# Patient Record
Sex: Female | Born: 1995 | Race: White | Hispanic: No | Marital: Single | Smoking: Former smoker
Health system: Southern US, Community
[De-identification: ages and names within clinical notes are randomized; demographics above are authoritative.]

## PROBLEM LIST (undated history)

## (undated) DIAGNOSIS — F32A Depression, unspecified: Secondary | ICD-10-CM

## (undated) DIAGNOSIS — F329 Major depressive disorder, single episode, unspecified: Secondary | ICD-10-CM

## (undated) HISTORY — PX: TONSILLECTOMY: SUR1361

## (undated) HISTORY — DX: Depression, unspecified: F32.A

---
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
## 2015-03-07 ENCOUNTER — Encounter: Payer: Self-pay | Admitting: Family Medicine

## 2015-03-07 LAB — HEPATITIS B SURFACE ANTIBODY,QUALITATIVE: Hep B S Ab: POSITIVE

## 2015-03-07 LAB — RUBEOLA ANTIBODY IGG: RUBEOLA AB, IGG: POSITIVE

## 2015-03-07 LAB — HEPATITIS B SURFACE ANTIGEN: Hepatitis B Surface Antigen: NEGATIVE

## 2015-03-07 LAB — HM HEPATITIS C SCREENING LAB: HM Hepatitis Screen: NEGATIVE

## 2015-04-12 ENCOUNTER — Encounter: Payer: Self-pay | Admitting: Family Medicine

## 2017-01-21 ENCOUNTER — Encounter: Payer: Self-pay | Admitting: Family Medicine

## 2018-01-20 LAB — RUBELLA IGG AB(REFL): Rubella IgG Quant: POSITIVE

## 2018-01-25 ENCOUNTER — Encounter: Payer: Self-pay | Admitting: Family Medicine

## 2018-04-16 LAB — MUMPS ANTIBODY, IGG: Mumps IgG: POSITIVE

## 2018-04-26 ENCOUNTER — Encounter: Payer: Self-pay | Admitting: Family Medicine

## 2019-08-02 ENCOUNTER — Telehealth: Payer: Self-pay | Admitting: *Deleted

## 2019-08-02 NOTE — Telephone Encounter (Signed)
Copied from [place] [id number]. Topic: Appointment Scheduling - Scheduling Inquiry for Clinic >> Aug 02, 2019  1:57 PM Celene Kras A wrote: Reason for CRM: Pt called and requested to be seen by Dr. Charlett Blake. Pt states she was referred by another doctor. Please advise.

## 2019-08-04 NOTE — Telephone Encounter (Signed)
Please advise 

## 2019-08-04 NOTE — Telephone Encounter (Signed)
Sure I will see

## 2019-08-09 NOTE — Telephone Encounter (Signed)
Please schedule patient for new patient appt for November or December

## 2019-08-10 NOTE — Telephone Encounter (Signed)
Called pt. No answer left msg.

## 2019-08-17 ENCOUNTER — Telehealth: Payer: Self-pay | Admitting: Family Medicine

## 2019-08-17 ENCOUNTER — Other Ambulatory Visit: Payer: Self-pay

## 2019-08-17 ENCOUNTER — Ambulatory Visit (INDEPENDENT_AMBULATORY_CARE_PROVIDER_SITE_OTHER): Payer: BC Managed Care – PPO | Admitting: Family Medicine

## 2019-08-17 ENCOUNTER — Encounter: Payer: Self-pay | Admitting: Family Medicine

## 2019-08-17 VITALS — BP 104/59 | HR 70 | Temp 98.7°F | Ht 68.0 in | Wt 202.0 lb

## 2019-08-17 DIAGNOSIS — R230 Cyanosis: Secondary | ICD-10-CM

## 2019-08-17 DIAGNOSIS — Z111 Encounter for screening for respiratory tuberculosis: Secondary | ICD-10-CM | POA: Diagnosis not present

## 2019-08-17 DIAGNOSIS — L659 Nonscarring hair loss, unspecified: Secondary | ICD-10-CM | POA: Insufficient documentation

## 2019-08-17 DIAGNOSIS — Z13220 Encounter for screening for lipoid disorders: Secondary | ICD-10-CM | POA: Diagnosis not present

## 2019-08-17 DIAGNOSIS — Z23 Encounter for immunization: Secondary | ICD-10-CM

## 2019-08-17 DIAGNOSIS — Z8659 Personal history of other mental and behavioral disorders: Secondary | ICD-10-CM

## 2019-08-17 DIAGNOSIS — N923 Ovulation bleeding: Secondary | ICD-10-CM

## 2019-08-17 DIAGNOSIS — Z0184 Encounter for antibody response examination: Secondary | ICD-10-CM

## 2019-08-17 NOTE — Progress Notes (Signed)
New Patient Office Visit  Subjective:  Patient ID: Allison [employer], female    DOB: Oct 17, 1996  Age: 23 y.o. MRN: [id number]  CC:  Chief Complaint  Patient presents with  . Establish Care    HPI [employer] presents for to establish care.  I have seen her mother for years.  She has a couple of concerns that she would like to discuss today.  She did let me know that she had actually lost 30 pounds in the last year.  She purposely tried to lose weight by using the keto diet.  But over the last 2 months has been losing hair.  She feels like is just a little bit all over her head.  She says she feels like it is constantly coming out and she is pulling it off her shirt.  She says that her scalp has been a little bit itchy but no rash.  She recently started taking some biotin, iron and multivitamin but has not felt like it made a noticeable difference in the hair loss and shedding.  She does have a history of anemia and says that her periods are regular with moderate flow.  Occasionally heavy but not persistently so.  She denies any abnormal rashes fevers or chills or sweats.  She also reports one episode where she noticed that her to looked blue.  It was maybe a month or so ago.  It is only happened 1 time and it did resolve pretty quickly.  No family history of autoimmune disorders.  She does report a history of anxiety but says is not currently having any issues with that.  She retired from [employer] on July 2019 and is currently starting college at [employer].  She also noted that she bleeds with intercourse.  It seems to be almost every time.  Otherwise she does not spot between her periods.  She does not have any abdominal pain or pelvic pain with intercourse.   Past Medical History:  Diagnosis Date  . Depressed     Past Surgical History:  Procedure Laterality Date  . TONSILLECTOMY      Family History  Problem Relation Age of Onset  . Hypertension Mother   . Colon  cancer Father     Social History   Socioeconomic History  . Marital status: Single    Spouse name: Not on file  . Number of children: 0  . Years of education: Not on file  . Highest education level: Not on file  Occupational History  . Occupation: Electronics engineer.   Social Needs  . Financial resource strain: Not on file  . Food insecurity    Worry: Not on file    Inability: Not on file  . Transportation needs    Medical: Not on file    Non-medical: Not on file  Tobacco Use  . Smoking status: Former Smoker    Packs/day: 5.00    Years: 3.00    Pack years: 15.00    Types: Cigarettes    Start date: 12/23/2015    Quit date: 08/17/2019  . Smokeless tobacco: Never Used  . Tobacco comment: currently vaping  Substance and Sexual Activity  . Alcohol use: Yes    Alcohol/week: 2.0 standard drinks    Types: 2 Glasses of wine per week  . Drug use: Never  . Sexual activity: Not Currently    Partners: Male    Birth control/protection: Abstinence, Inserts    Comment: ring  Lifestyle  .  Physical activity    Days per week: Not on file    Minutes per session: Not on file  . Stress: Not on file  Relationships  . Social Herbalist on phone: Not on file    Gets together: Not on file    Attends religious service: Not on file    Active member of club or organization: Not on file    Attends meetings of clubs or organizations: Not on file    Relationship status: Not on file  . Intimate partner violence    Fear of current or ex partner: Not on file    Emotionally abused: Not on file    Physically abused: Not on file    Forced sexual activity: Not on file  Other Topics Concern  . Not on file  Social History Narrative   Step mom is [employer].     ROS Review of Systems  Constitutional: Negative for diaphoresis, fever and unexpected weight change.  HENT: Negative for hearing loss, postnasal drip, sneezing and tinnitus.   Eyes: Negative for visual disturbance.   Respiratory: Negative for cough and wheezing.   Cardiovascular: Negative for chest pain and palpitations.  Genitourinary: Negative for vaginal bleeding and vaginal discharge.  Musculoskeletal: Negative for arthralgias.  Skin:       Hair and nails concern  Neurological: Negative for headaches.  Hematological: Negative for adenopathy. Does not bruise/bleed easily.    Objective:   Today's Vitals: BP (!) 104/59   Pulse 70   Temp 98.7 F (37.1 C)   Ht '5\' 8"'  (1.727 m)   Wt 202 lb (91.6 kg)   LMP 07/28/2019 (Approximate)   SpO2 99%   BMI 30.71 kg/m   Physical Exam Constitutional:      Appearance: She is well-developed.  HENT:     Head: Normocephalic and atraumatic.  Cardiovascular:     Rate and Rhythm: Normal rate and regular rhythm.     Heart sounds: Normal heart sounds.  Pulmonary:     Effort: Pulmonary effort is normal.     Breath sounds: Normal breath sounds.  Skin:    Kerr: Skin is warm and dry.  Neurological:     Mental Status: She is alert and oriented to person, place, and time.  Psychiatric:        Behavior: Behavior normal.     Assessment & Plan:   Problem List Items Addressed This Visit      Other   Intermenstrual bleeding    Cars only with intercourse.  She really does not have any pelvic pain or problems during intercourse.  She is due for her Pap smear so I plan to schedule her for a physical and Pap next month.  We will rule out any abnormalities of the vaginal canal or cervix.  Can consider further work-up if needed at that time.      History of anxiety    Not currently an active problem but will monitor for this periodically.      Hair loss    It sounds like it is diffuse hair loss.  At such a young age I suspect that it is probably just transient.  She has been under a lot of stress in the last year with retiring from the [employer] and starting at [employer] and then having to switch schools.  Sometimes a physical and emotional stress can  actually cause hair loss for several months that usually will resolve on its own but we  will do some additional labs just to rule out anemia especially with prior history etc.      Relevant Orders   C-reactive protein   ANA   TSH   Sed Rate (ESR)   CBC   Blue toes - Primary    This actually only occurred once so encouraged her just to keep an eye on things.  We will check a CBC and ANA.      Relevant Orders   C-reactive protein   ANA   TSH   Sed Rate (ESR)   CBC    Other Visit Diagnoses    Need for immunization against influenza       Relevant Orders   Flu Vaccine QUAD 36+ mos IM (Completed)   Screening, lipid       Relevant Orders   COMPLETE METABOLIC PANEL WITH GFR   Lipid Panel w/reflex Direct LDL   Immunity status testing       Relevant Orders   Measles/Mumps/Rubella Immunity   Encounter for PPD test       Need for tetanus, diphtheria, and acellular pertussis (Tdap) vaccine in patient of adolescent age or older       Relevant Orders   Tdap vaccine greater than or equal to 7yo IM (Completed)   Screening for tuberculosis       Relevant Orders   QuantiFERON-TB Gold Plus      Outpatient Encounter Medications as of 08/17/2019  Medication Sig  . [DISCONTINUED] citalopram (CELEXA) 40 MG tablet Take 40 mg by mouth daily.   No facility-administered encounter medications on file as of 08/17/2019.     Follow-up: Return in about 4 weeks (around 09/14/2019) for CPE with pap smear.   Beatrice Lecher, MD

## 2019-08-17 NOTE — Assessment & Plan Note (Signed)
Cars only with intercourse.  She really does not have any pelvic pain or problems during intercourse.  She is due for her Pap smear so I plan to schedule her for a physical and Pap next month.  We will rule out any abnormalities of the vaginal canal or cervix.  Can consider further work-up if needed at that time.

## 2019-08-17 NOTE — Assessment & Plan Note (Signed)
It sounds like it is diffuse hair loss.  At such a young age I suspect that it is probably just transient.  She has been under a lot of stress in the last year with retiring from the [employer] and starting at [employer] and then having to switch schools.  Sometimes a physical and emotional stress can actually cause hair loss for several months that usually will resolve on its own but we will do some additional labs just to rule out anemia especially with prior history etc.

## 2019-08-17 NOTE — Assessment & Plan Note (Signed)
This actually only occurred once so encouraged her just to keep an eye on things.  We will check a CBC and ANA.

## 2019-08-17 NOTE — Telephone Encounter (Signed)
Please call patient and see where she goes for her GYN and where she had her Pap smear done.  She may have total time yesterday I am not sure but if we can call and get that report that would be great.  I know she said she had it done about 2 years ago.

## 2019-08-17 NOTE — Assessment & Plan Note (Signed)
Not currently an active problem but will monitor for this periodically.

## 2019-08-19 NOTE — Telephone Encounter (Signed)
Pt had this done while she was in the [employer]. I put her records on your desk.Maryruth Eve, Lahoma Crocker, CMA

## 2019-08-20 LAB — QUANTIFERON-TB GOLD PLUS
Mitogen-NIL: 8.08 IU/mL
NIL: 0.04 IU/mL
QuantiFERON-TB Gold Plus: NEGATIVE
TB1-NIL: 0 IU/mL
TB2-NIL: <0 IU/mL

## 2019-08-20 LAB — COMPLETE METABOLIC PANEL WITH GFR
AG Ratio: 1.9 (calc) (ref 1.0–2.5)
ALT: 15 U/L (ref 6–29)
AST: 22 U/L (ref 10–30)
Albumin: 4.7 g/dL (ref 3.6–5.1)
Alkaline phosphatase (APISO): 84 U/L (ref 31–125)
BUN: 19 mg/dL (ref 7–25)
CO2: 26 mmol/L (ref 20–32)
Calcium: 10 mg/dL (ref 8.6–10.2)
Chloride: 102 mmol/L (ref 98–110)
Creat: 1.02 mg/dL (ref 0.50–1.10)
GFR, Est African American: 89 mL/min/{1.73_m2} (ref >=60)
GFR, Est Non African American: 77 mL/min/{1.73_m2} (ref >=60)
Globulin: 2.5 g/dL (calc) (ref 1.9–3.7)
Glucose, Bld: 88 mg/dL (ref 65–99)
Potassium: 4.6 mmol/L (ref 3.5–5.3)
Sodium: 137 mmol/L (ref 135–146)
Total Bilirubin: 0.3 mg/dL (ref 0.2–1.2)
Total Protein: 7.2 g/dL (ref 6.1–8.1)

## 2019-08-20 LAB — CBC
HCT: 43.2 % (ref 35.0–45.0)
Hemoglobin: 13.9 g/dL (ref 11.7–15.5)
MCH: 27.9 pg (ref 27.0–33.0)
MCHC: 32.2 g/dL (ref 32.0–36.0)
MCV: 86.7 fL (ref 80.0–100.0)
MPV: 11.2 fL (ref 7.5–12.5)
Platelets: 250 10*3/uL (ref 140–400)
RBC: 4.98 10*6/uL (ref 3.80–5.10)
RDW: 13.1 % (ref 11.0–15.0)
WBC: 8.2 10*3/uL (ref 3.8–10.8)

## 2019-08-20 LAB — MEASLES/MUMPS/RUBELLA IMMUNITY
Mumps IgG: 23.2 AU/mL
Rubella: 1.84 index
Rubeola IgG: >300 AU/mL

## 2019-08-20 LAB — ANA: Anti Nuclear Antibody (ANA): NEGATIVE

## 2019-08-20 LAB — SEDIMENTATION RATE: Sed Rate: 9 mm/h (ref 0–20)

## 2019-08-20 LAB — LIPID PANEL W/REFLEX DIRECT LDL
Cholesterol: 201 mg/dL — ABNORMAL HIGH (ref <200)
HDL: 46 mg/dL — ABNORMAL LOW (ref >=50)
LDL Cholesterol (Calc): 136 mg/dL (calc) — ABNORMAL HIGH
Non-HDL Cholesterol (Calc): 155 mg/dL (calc) — ABNORMAL HIGH (ref <130)
Total CHOL/HDL Ratio: 4.4 (calc) (ref <5.0)
Triglycerides: 88 mg/dL (ref <150)

## 2019-08-20 LAB — TSH: TSH: 2.84 mIU/L

## 2019-08-20 LAB — C-REACTIVE PROTEIN: CRP: 4.9 mg/L (ref <8.0)

## 2019-09-14 ENCOUNTER — Encounter: Payer: Self-pay | Admitting: Family Medicine

## 2019-09-14 ENCOUNTER — Ambulatory Visit (INDEPENDENT_AMBULATORY_CARE_PROVIDER_SITE_OTHER): Payer: BC Managed Care – PPO | Admitting: Family Medicine

## 2019-09-14 ENCOUNTER — Other Ambulatory Visit (HOSPITAL_COMMUNITY)
Admission: RE | Admit: 2019-09-14 | Discharge: 2019-09-14 | Disposition: A | Discharge status: Home / Self Care | Payer: BC Managed Care – PPO | Source: Ambulatory Visit | Attending: Family Medicine | Admitting: Family Medicine

## 2019-09-14 ENCOUNTER — Other Ambulatory Visit: Payer: Self-pay

## 2019-09-14 VITALS — BP 119/65 | HR 71 | Ht 68.0 in | Wt 202.0 lb

## 2019-09-14 DIAGNOSIS — Z113 Encounter for screening for infections with a predominantly sexual mode of transmission: Secondary | ICD-10-CM

## 2019-09-14 DIAGNOSIS — Z0000 Encounter for general adult medical examination without abnormal findings: Secondary | ICD-10-CM

## 2019-09-14 DIAGNOSIS — Z124 Encounter for screening for malignant neoplasm of cervix: Secondary | ICD-10-CM | POA: Diagnosis not present

## 2019-09-14 NOTE — Progress Notes (Signed)
Subjective:     [employer] Kerr is a 23 y.o. female and is here for a comprehensive physical exam. The patient reports no problems.  She is exercising regularly.  She says she is back in school full-time and just has really struggled with focus.  She is never really had any major issues with that before but says just feels like she gets very easily distracted during class.  She is never been diagnosed with ADD or ADHD.  She is currently on citalopram and BuSpar for mood but her [facility] provider has recommended switching her to Wellbutrin so she is expecting a shipment in the mail any day now.  Still having hair loss. No localized loss.    Social History   Socioeconomic History  . Marital status: Single    Spouse name: Not on file  . Number of children: 0  . Years of education: Not on file  . Highest education level: Not on file  Occupational History  . Occupation: Archivist.   Social Needs  . Financial resource strain: Not on file  . Food insecurity    Worry: Not on file    Inability: Not on file  . Transportation needs    Medical: Not on file    Non-medical: Not on file  Tobacco Use  . Smoking status: Former Smoker    Packs/day: 5.00    Years: 3.00    Pack years: 15.00    Types: Cigarettes    Start date: 12/23/2015    Quit date: 08/17/2019    Years since quitting: 0.0  . Smokeless tobacco: Never Used  . Tobacco comment: currently vaping  Substance and Sexual Activity  . Alcohol use: Yes    Alcohol/week: 2.0 standard drinks    Types: 2 Glasses of wine per week  . Drug use: Never  . Sexual activity: Not Currently    Partners: Male    Birth control/protection: Abstinence, Inserts    Comment: ring  Lifestyle  . Physical activity    Days per week: Not on file    Minutes per session: Not on file  . Stress: Not on file  Relationships  . Social Musician on phone: Not on file    Gets together: Not on file    Attends religious service: Not on file    Active member  of club or organization: Not on file    Attends meetings of clubs or organizations: Not on file    Relationship status: Not on file  . Intimate partner violence    Fear of current or ex partner: Not on file    Emotionally abused: Not on file    Physically abused: Not on file    Forced sexual activity: Not on file  Other Topics Concern  . Not on file  Social History Narrative   Step mom is Kimberley Speece.    Health Maintenance  Topic Date Due  . HIV Screening  05/03/2010  . PAP-Cervical Cytology Screening  05/03/2016  . PAP SMEAR-Modifier  05/03/2016  . TETANUS/TDAP  08/16/2029  . INFLUENZA VACCINE  Completed    The following portions of the patient's history were reviewed and updated as appropriate: allergies, current medications, past family history, past medical history, past social history, past surgical history and problem list.  Review of Systems  Review of systems not obtained due to patient factors.   Objective:      BP 119/65   Pulse 71   Ht 5\' 8"  (1.727  m)   Wt 202 lb (91.6 kg)   SpO2 99%   BMI 30.71 kg/m  General appearance: alert, cooperative and appears stated age Head: Normocephalic, without obvious abnormality, atraumatic Eyes: conj clear, EOMI, PEERLA Ears: normal TM's and external ear canals both ears Nose: Nares normal. Septum midline. Mucosa normal. No drainage or sinus tenderness. Throat: lips, mucosa, and tongue normal; teeth and gums normal Neck: no adenopathy, no carotid bruit, no JVD, supple, symmetrical, trachea midline and thyroid not enlarged, symmetric, no tenderness/mass/nodules Back: symmetric, no curvature. ROM normal. No CVA tenderness. Lungs: clear to auscultation bilaterally Breasts: normal appearance, no masses or tenderness Heart: regular rate and rhythm, S1, S2 normal, no murmur, click, rub or gallop Abdomen: soft, non-tender; bowel sounds normal; no masses,  no organomegaly Pelvic: cervix normal in appearance, external genitalia  normal, no adnexal masses or tenderness, no cervical motion tenderness, rectovaginal septum normal, uterus normal size, shape, and consistency and vagina normal without discharge Extremities: extremities normal, atraumatic, no cyanosis or edema Pulses: 2+ and symmetric Skin: Skin color, texture, turgor normal. No rashes or lesions Lymph nodes: Cervical, supraclavicular, and axillary nodes normal. Neurologic: Alert and oriented X 3, normal strength and tone. Normal symmetric reflexes. Normal coordination and gait    Assessment:    Healthy female exam.      Plan:     See After Visit Summary for Counseling Recommendations   Keep up a regular exercise program and make sure you are eating a healthy diet Try to eat 4 servings of dairy a day, or if you are lactose intolerant take a calcium with vitamin D daily.  Your vaccines are up to date.   Inattention-  Hair Loss-

## 2019-09-14 NOTE — Patient Instructions (Signed)
Health Maintenance, Female Adopting a healthy lifestyle and getting preventive care are important in promoting health and wellness. Ask your health care provider about:  The right schedule for you to have regular tests and exams.  Things you can do on your own to prevent diseases and keep yourself healthy. What should I know about diet, weight, and exercise? Eat a healthy diet   Eat a diet that includes plenty of vegetables, fruits, low-fat dairy products, and lean protein.  Do not eat a lot of foods that are high in solid fats, added sugars, or sodium. Maintain a healthy weight Body mass index (BMI) is used to identify weight problems. It estimates body fat based on height and weight. Your health care provider can help determine your BMI and help you achieve or maintain a healthy weight. Get regular exercise Get regular exercise. This is one of the most important things you can do for your health. Most adults should:  Exercise for at least 150 minutes each week. The exercise should increase your heart rate and make you sweat (moderate-intensity exercise).  Do strengthening exercises at least twice a week. This is in addition to the moderate-intensity exercise.  Spend less time sitting. Even light physical activity can be beneficial. Watch cholesterol and blood lipids Have your blood tested for lipids and cholesterol at 24 years of age, then have this test every 5 years. Have your cholesterol levels checked more often if:  Your lipid or cholesterol levels are high.  You are older than 24 years of age.  You are at high risk for heart disease. What should I know about cancer screening? Depending on your health history and family history, you may need to have cancer screening at various ages. This may include screening for:  Breast cancer.  Cervical cancer.  Colorectal cancer.  Skin cancer.  Lung cancer. What should I know about heart disease, diabetes, and high blood  pressure? Blood pressure and heart disease  High blood pressure causes heart disease and increases the risk of stroke. This is more likely to develop in people who have high blood pressure readings, are of African descent, or are overweight.  Have your blood pressure checked: ? Every 3-5 years if you are 18-39 years of age. ? Every year if you are 40 years old or older. Diabetes Have regular diabetes screenings. This checks your fasting blood sugar level. Have the screening done:  Once every three years after age 40 if you are at a normal weight and have a low risk for diabetes.  More often and at a younger age if you are overweight or have a high risk for diabetes. What should I know about preventing infection? Hepatitis B If you have a higher risk for hepatitis B, you should be screened for this virus. Talk with your health care provider to find out if you are at risk for hepatitis B infection. Hepatitis C Testing is recommended for:  Everyone born from 1945 through 1965.  Anyone with known risk factors for hepatitis C. Sexually transmitted infections (STIs)  Get screened for STIs, including gonorrhea and chlamydia, if: ? You are sexually active and are younger than 24 years of age. ? You are older than 24 years of age and your health care provider tells you that you are at risk for this type of infection. ? Your sexual activity has changed since you were last screened, and you are at increased risk for chlamydia or gonorrhea. Ask your health care provider if   you are at risk.  Ask your health care provider about whether you are at high risk for HIV. Your health care provider may recommend a prescription medicine to help prevent HIV infection. If you choose to take medicine to prevent HIV, you should first get tested for HIV. You should then be tested every 3 months for as long as you are taking the medicine. Pregnancy  If you are about to stop having your period (premenopausal) and  you may become pregnant, seek counseling before you get pregnant.  Take 400 to 800 micrograms (mcg) of folic acid every day if you become pregnant.  Ask for birth control (contraception) if you want to prevent pregnancy. Osteoporosis and menopause Osteoporosis is a disease in which the bones lose minerals and strength with aging. This can result in bone fractures. If you are 65 years old or older, or if you are at risk for osteoporosis and fractures, ask your health care provider if you should:  Be screened for bone loss.  Take a calcium or vitamin D supplement to lower your risk of fractures.  Be given hormone replacement therapy (HRT) to treat symptoms of menopause. Follow these instructions at home: Lifestyle  Do not use any products that contain nicotine or tobacco, such as cigarettes, e-cigarettes, and chewing tobacco. If you need help quitting, ask your health care provider.  Do not use street drugs.  Do not share needles.  Ask your health care provider for help if you need support or information about quitting drugs. Alcohol use  Do not drink alcohol if: ? Your health care provider tells you not to drink. ? You are pregnant, may be pregnant, or are planning to become pregnant.  If you drink alcohol: ? Limit how much you use to 0-1 drink a day. ? Limit intake if you are breastfeeding.  Be aware of how much alcohol is in your drink. In the [place] one drink equals one 12 oz bottle of beer (355 mL), one 5 oz glass of wine (148 mL), or one 1 oz glass of hard liquor (44 mL). General instructions  Schedule regular health, dental, and eye exams.  Stay current with your vaccines.  Tell your health care provider if: ? You often feel depressed. ? You have ever been abused or do not feel safe at home. Summary  Adopting a healthy lifestyle and getting preventive care are important in promoting health and wellness.  Follow your health care provider's instructions about healthy  diet, exercising, and getting tested or screened for diseases.  Follow your health care provider's instructions on monitoring your cholesterol and blood pressure. This information is not intended to replace advice given to you by your health care provider. Make sure you discuss any questions you have with your health care provider. Document Released: 06/23/2011 Document Revised: 12/01/2018 Document Reviewed: 12/01/2018 Elsevier Patient Education  2020 [employer]  

## 2019-09-15 LAB — RPR: RPR Ser Ql: NONREACTIVE

## 2019-09-15 LAB — HIV ANTIBODY (ROUTINE TESTING W REFLEX): HIV 1&2 Ab, 4th Generation: NONREACTIVE

## 2019-09-15 LAB — HEPATITIS C ANTIBODY
Hepatitis C Ab: NONREACTIVE
SIGNAL TO CUT-OFF: 0.01 (ref <1.00)

## 2019-09-16 LAB — CYTOLOGY - PAP
Chlamydia: NEGATIVE
Diagnosis: NEGATIVE
Molecular Disclaimer: NEGATIVE
Molecular Disclaimer: NEGATIVE
Molecular Disclaimer: NORMAL
Neisseria Gonorrhea: NEGATIVE
Trichomonas: NEGATIVE

## 2019-09-16 NOTE — Progress Notes (Signed)
Call patient: Your Pap smear is normal. Repeat in 2-3 years. STD screening is neg as well.

## 2019-10-08 DIAGNOSIS — Z711 Person with feared health complaint in whom no diagnosis is made: Secondary | ICD-10-CM | POA: Diagnosis not present

## 2019-10-10 ENCOUNTER — Encounter: Payer: Self-pay | Admitting: Family Medicine

## 2019-10-10 NOTE — Telephone Encounter (Signed)
Patient wanted to discuss this with you, even tho you are not witting RX. I added patient to your schedule tomorrow for virtual visit. Patient advised if any worsening or new SX, seek emergency care.

## 2019-10-11 ENCOUNTER — Ambulatory Visit (INDEPENDENT_AMBULATORY_CARE_PROVIDER_SITE_OTHER): Payer: Self-pay | Admitting: Family Medicine

## 2019-10-11 ENCOUNTER — Encounter: Payer: Self-pay | Admitting: Family Medicine

## 2019-10-11 ENCOUNTER — Other Ambulatory Visit: Payer: Self-pay

## 2019-10-11 VITALS — BP 127/69 | HR 72 | Temp 98.3°F | Ht 68.0 in | Wt 200.0 lb

## 2019-10-11 DIAGNOSIS — Z91040 Latex allergy status: Secondary | ICD-10-CM | POA: Insufficient documentation

## 2019-10-11 DIAGNOSIS — T7840XA Allergy, unspecified, initial encounter: Secondary | ICD-10-CM

## 2019-10-11 DIAGNOSIS — Z309 Encounter for contraceptive management, unspecified: Secondary | ICD-10-CM | POA: Insufficient documentation

## 2019-10-11 DIAGNOSIS — Z3009 Encounter for other general counseling and advice on contraception: Secondary | ICD-10-CM

## 2019-10-11 MED ORDER — ETONOGESTREL-ETHINYL ESTRADIOL 0.12-0.015 MG/24HR VA RING: VAGINAL_RING | VAGINAL | 4 refills | Status: DC

## 2019-10-11 NOTE — Assessment & Plan Note (Signed)
Sent over prescription for NuvaRing.

## 2019-10-11 NOTE — Progress Notes (Signed)
Virtual Visit via Video Note  I connected with Allison Kerr on 10/11/19 at 10:10 AM EDT by a video enabled telemedicine application and verified that I am speaking with the correct person using two identifiers.   I discussed the limitations of evaluation and management by telemedicine and the availability of in person appointments. The patient expressed understanding and agreed to proceed.     Acute Office Visit  Subjective:    Patient ID: Allison Kerr, female    DOB: 02-Jul-1996, 23 y.o.   MRN: [id number]  Chief Complaint  Patient presents with  . Allergic Reaction    HPI Patient is in today for probable allergic reaction.  She says on Thursday evening she had sex and used a latex condom.  She says during intercourse he actually slightly bit her lip she noticed that it started swelling pretty rapidly.  At bedtime she got in the shower she noticed that she had hives mostly over her forearms.  She says by the next morning she still had hives and so she actually took some Benadryl which did seem to help.  Then she went to class and started feeling like she was getting some chest discomfort.  Somewhere between a pressure and a most like reflux sensation that she has never had heartburn or reflux previously.  She said she went home and took another Benadryl and try to just lay down and rest for couple hours but when she went back up she was still having some chest discomfort so the following morning she actually went to urgent care.  She says they basically just checked her vitals and listen to her heart and told her that she seemed to be fine and to call if she had any new symptoms.  He still has a little bit of chest discomfort but the hives seem to have resolved completely.  Currently on NuvaRing for birth control and inserted a new ring on Friday the day after the hives broke out but she is never had any problems with medication before.  She is a former smoker.  She has a Nuvaring for birth  control.  She would like a refill on you.    She was also recently started on Wellbutrin through the [place] and just wants to make sure that the chest discomfort is not a reaction to the medication.  Past Medical History:  Diagnosis Date  . Depressed     Past Surgical History:  Procedure Laterality Date  . TONSILLECTOMY      Family History  Problem Relation Age of Onset  . Hypertension Mother   . Colon cancer Father     Social History   Socioeconomic History  . Marital status: Single    Spouse name: Not on file  . Number of children: 0  . Years of education: Not on file  . Highest education level: Not on file  Occupational History  . Occupation: Archivist.   Social Needs  . Financial resource strain: Not on file  . Food insecurity    Worry: Not on file    Inability: Not on file  . Transportation needs    Medical: Not on file    Non-medical: Not on file  Tobacco Use  . Smoking status: Former Smoker    Packs/day: 5.00    Years: 3.00    Pack years: 15.00    Types: Cigarettes    Start date: 12/23/2015    Quit date: 08/17/2019    Years since quitting: 0.1  .  Smokeless tobacco: Never Used  . Tobacco comment: currently vaping  Substance and Sexual Activity  . Alcohol use: Yes    Alcohol/week: 2.0 standard drinks    Types: 2 Glasses of wine per week  . Drug use: Never  . Sexual activity: Not Currently    Partners: Male    Birth control/protection: Abstinence, Inserts    Comment: ring  Lifestyle  . Physical activity    Days per week: Not on file    Minutes per session: Not on file  . Stress: Not on file  Relationships  . Social Musicianconnections    Talks on phone: Not on file    Gets together: Not on file    Attends religious service: Not on file    Active member of club or organization: Not on file    Attends meetings of clubs or organizations: Not on file    Relationship status: Not on file  . Intimate partner violence    Fear of current or ex partner: Not on  file    Emotionally abused: Not on file    Physically abused: Not on file    Forced sexual activity: Not on file  Other Topics Concern  . Not on file  Social History Narrative   Step mom is Juleen Starrngela Sovine.     Outpatient Medications Prior to Visit  Medication Sig Dispense Refill  . buPROPion (WELLBUTRIN XL) 150 MG 24 hr tablet Take 300 mg by mouth daily.     No facility-administered medications prior to visit.     Allergies  Allergen Reactions  . Latex Hives, Swelling, Rash and Other (See Comments)    Chest tightness    ROS     Objective:    Physical Exam  Constitutional: She is oriented to person, place, and time. She appears well-developed and well-nourished.  HENT:  Head: Normocephalic and atraumatic.  Eyes: Conjunctivae and EOM are normal.  Pulmonary/Chest: Effort normal.  Neurological: She is alert and oriented to person, place, and time.  Skin: Skin is dry. No pallor.  Psychiatric: She has a normal mood and affect. Her behavior is normal.  Vitals reviewed.   BP 127/69   Pulse 72   Temp 98.3 F (36.8 C)   Ht 5\' 8"  (1.727 m)   Wt 200 lb (90.7 kg)   BMI 30.41 kg/m  Wt Readings from Last 3 Encounters:  10/11/19 200 lb (90.7 kg)  09/14/19 202 lb (91.6 kg)  08/17/19 202 lb (91.6 kg)    There are no preventive care reminders to display for this patient.  There are no preventive care reminders to display for this patient.   Lab Results  Component Value Date   TSH 2.84 08/17/2019   Lab Results  Component Value Date   WBC 8.2 08/17/2019   HGB 13.9 08/17/2019   HCT 43.2 08/17/2019   MCV 86.7 08/17/2019   PLT 250 08/17/2019   Lab Results  Component Value Date   NA 137 08/17/2019   K 4.6 08/17/2019   CO2 26 08/17/2019   GLUCOSE 88 08/17/2019   BUN 19 08/17/2019   CREATININE 1.02 08/17/2019   BILITOT 0.3 08/17/2019   AST 22 08/17/2019   ALT 15 08/17/2019   PROT 7.2 08/17/2019   CALCIUM 10.0 08/17/2019   Lab Results  Component Value Date    CHOL 201 (H) 08/17/2019   Lab Results  Component Value Date   HDL 46 (L) 08/17/2019   Lab Results  Component Value Date  [place] 136 (H) 08/17/2019   Lab Results  Component Value Date   TRIG 88 08/17/2019   Lab Results  Component Value Date   CHOLHDL 4.4 08/17/2019   No results found for: HGBA1C     Assessment & Plan:   Problem List Items Addressed This Visit      Other   Latex allergy   Contraceptive management    Sent over prescription for NuvaRing.       Other Visit Diagnoses    Allergic reaction, initial encounter    -  Primary     Allergic reaction to latex likely.  Added to intolerance list.  Try to avoid latex if at all possible sounds like she had a pretty impressive and significant reaction.  If that happens again then take Benadryl immediately.  Since she is improving okay to switch to a longer acting antihistamine such as Claritin Zyrtec or Allegra.  Atypical chest pain-she is also been getting some chest discomfort so even though her vitals were normal at urgent care no sign of tachycardia or hypoxemia she feels like it is uncomfortable.  She does feel like it is aggravated by drinking caffeine.  So we discussed avoiding caffeine and maybe even trying a reflux medicine such as an H2 blocker like Zantac or Pepcid which can also help with allergic reaction in addition to reducing reflux.  Recommend 2-week trial.  If not improving over that timeframe I be happy to see her back in office so that we can do an additional work-up work-up including EKG etc.  I do not think this is a reaction to the Wellbutrin and gave her reassurance but certainly if it persists that something that we could look at as this medication was started recently.     Meds ordered this encounter  Medications  . etonogestrel-ethinyl estradiol (NUVARING) 0.12-0.015 MG/24HR vaginal ring    Sig: Insert vaginally and leave in place for 3 consecutive weeks, then remove for 1 week.    Dispense:  3  each    Refill:  4     I discussed the assessment and treatment plan with the patient. The patient was provided an opportunity to ask questions and all were answered. The patient agreed with the plan and demonstrated an understanding of the instructions.   The patient was advised to call back or seek an in-person evaluation if the symptoms worsen or if the condition fails to improve as anticipated.  Beatrice Lecher, MD

## 2020-01-13 ENCOUNTER — Encounter: Payer: Self-pay | Admitting: Family Medicine

## 2020-01-13 ENCOUNTER — Telehealth (INDEPENDENT_AMBULATORY_CARE_PROVIDER_SITE_OTHER): Payer: BC Managed Care – PPO | Admitting: Family Medicine

## 2020-01-13 VITALS — Ht 68.0 in | Wt 200.0 lb

## 2020-01-13 DIAGNOSIS — L659 Nonscarring hair loss, unspecified: Secondary | ICD-10-CM

## 2020-01-13 MED ORDER — ROGAINE WOMENS 5 % EX FOAM: 1.0000 "application " | Freq: Every day | CUTANEOUS | 5 refills | Status: DC

## 2020-01-13 NOTE — Progress Notes (Signed)
Pt denies any changes in medications,diet. She hasn't tried any shampoos for hair loss.she has been taking more biotin and iron.

## 2020-01-13 NOTE — Progress Notes (Addendum)
Virtual Visit via Video Note  I connected with Allison Kerr on 01/13/20 at  2:20 PM EST by a video enabled telemedicine application and verified that I am speaking with the correct person using two identifiers.   I discussed the limitations of evaluation and management by telemedicine and the availability of in person appointments. The patient expressed understanding and agreed to proceed.  Subjective:    CC:   HPI: 24 year old female complains of diffuse hair loss has been going on since about May.  She says its been pretty steady over that time it has not necessarily gotten worse.  She denies localized areas of hair loss or shedding.  She does not use any chemicals on her hair including perms and hair dyes.  She says she has been using the same shampoo for years until recently she did change it just to see if that would help with hair growth.  She also started taking iron and biotin to help hair growth.  She says her father is bald.  She feels like overall her scalp is pretty healthy.  She denies any significant rash redness or itching.  She says she might occasionally get a little itchy spot.  She is not currently using any specific treatments for it and has never seen a dermatologist for this.   Past medical history, Surgical history, Family history not pertinant except as noted below, Social history, Allergies, and medications have been entered into the medical record, reviewed, and corrections made.   Review of Systems: No fevers, chills, night sweats, weight loss, chest pain, or shortness of breath.   Objective:    General: Speaking clearly in complete sentences without any shortness of breath.  Alert and oriented x3.  Normal judgment. No apparent acute distress.    Impression and Recommendations:    Hair loss-discussed different types of hair loss.  Does not sound consistent with alopecia areata.  Recommend a trial of women's Rogaine.  We will send over prescription to the  pharmacy.  We also discussed possible dermatology consultation.  Also consider additional treatment options if the Rogaine by itself is not enough can consider finasteride.  She says she is not pregnant and is not trying to get pregnant and that is important if she is going to use the Rogaine.  Discussed that with her.   Labs in August were normal ruling out any type of thyroid disorder etc.  Time spent 22 min in encounter.   I discussed the assessment and treatment plan with the patient. The patient was provided an opportunity to ask questions and all were answered. The patient agreed with the plan and demonstrated an understanding of the instructions.   The patient was advised to call back or seek an in-person evaluation if the symptoms worsen or if the condition fails to improve as anticipated.   Nani Gasser, MD

## 2020-02-28 DIAGNOSIS — Z202 Contact with and (suspected) exposure to infections with a predominantly sexual mode of transmission: Secondary | ICD-10-CM | POA: Diagnosis not present

## 2020-02-28 DIAGNOSIS — Z20828 Contact with and (suspected) exposure to other viral communicable diseases: Secondary | ICD-10-CM | POA: Diagnosis not present

## 2020-02-28 DIAGNOSIS — Z113 Encounter for screening for infections with a predominantly sexual mode of transmission: Secondary | ICD-10-CM | POA: Diagnosis not present

## 2020-03-09 DIAGNOSIS — Z79899 Other long term (current) drug therapy: Secondary | ICD-10-CM | POA: Diagnosis not present

## 2020-03-09 DIAGNOSIS — L639 Alopecia areata, unspecified: Secondary | ICD-10-CM | POA: Diagnosis not present

## 2020-03-09 DIAGNOSIS — L659 Nonscarring hair loss, unspecified: Secondary | ICD-10-CM | POA: Diagnosis not present

## 2020-04-16 ENCOUNTER — Encounter: Payer: Self-pay | Admitting: Family Medicine

## 2020-05-15 ENCOUNTER — Encounter: Payer: Self-pay | Admitting: Family Medicine

## 2020-05-16 ENCOUNTER — Encounter: Payer: Self-pay | Admitting: Osteopathic Medicine

## 2020-05-16 ENCOUNTER — Other Ambulatory Visit: Payer: Self-pay

## 2020-05-16 ENCOUNTER — Telehealth (INDEPENDENT_AMBULATORY_CARE_PROVIDER_SITE_OTHER): Payer: BC Managed Care – PPO | Admitting: Osteopathic Medicine

## 2020-05-16 VITALS — Wt 197.0 lb

## 2020-05-16 DIAGNOSIS — Z3009 Encounter for other general counseling and advice on contraception: Secondary | ICD-10-CM

## 2020-05-16 NOTE — Telephone Encounter (Signed)
Was this discussed in todays visit?

## 2020-05-16 NOTE — Progress Notes (Signed)
Virtual Visit via Video (App used: MyChart) Note  I connected with      [name] on 05/16/20 at 1:57 PM  by a telemedicine application and verified that I am speaking with the correct person using two identifiers.  Patient is at home I am in office   I discussed the limitations of evaluation and management by telemedicine and the availability of in person appointments. The patient expressed understanding and agreed to proceed.  History of Present Illness: Allison Kerr is a 24 y.o. female who would like to discuss IUD   Has been on NuvaRing for some time, recent concerns about its effects on mental health have led her to consider nonhormonal IUD.  Previously on Mirena IUD.      Observations/Objective: Wt 197 lb (89.4 kg)   LMP 05/10/2020   BMI 29.95 kg/m  BP Readings from Last 3 Encounters:  10/11/19 127/69  09/14/19 119/65  08/17/19 (!) 104/59   Exam: Normal Speech.  NAD  Lab and Radiology Results No results found for this or any previous visit (from the past 72 hour(s)). No results found.     Assessment and Plan: 24 y.o. female with The encounter diagnosis was Encounter for counseling regarding contraception.  Discussion of risks versus benefits of copper IUD were discussed, particularly risk for more intense cramping/heavier bleeding with periods, risks of insertion procedure including infection, uterine perforation, other trauma.  Benefits certainly include patient would like to have nonhormonal reliable reversible contraception in place so ParaGard would be an excellent option for her.  She agrees to proceed with IUD insertion as scheduled, was advised to premedicate at least 1 hour prior with 800 mg ibuprofen, what to expect after procedure including bleeding/cramping.  Was advised we will confirm negative pregnancy test prior to IUD insertion.  Patient declines STD testing, recently tested several months ago and no new exposures since that  time.    Follow Up Instructions: Return for KEEP CURRENTLY SCHEDULED APPOINTMENT.    I discussed the assessment and treatment plan with the patient. The patient was provided an opportunity to ask questions and all were answered. The patient agreed with the plan and demonstrated an understanding of the instructions.   The patient was advised to call back or seek an in-person evaluation if any new concerns, if symptoms worsen or if the condition fails to improve as anticipated.  15 minutes of non-face-to-face time was provided during this encounter.      . . . . . . . . . . . . . Marland Kitchen                   Historical information moved to improve visibility of documentation.  Past Medical History:  Diagnosis Date  . Depressed    Past Surgical History:  Procedure Laterality Date  . TONSILLECTOMY     Social History   Tobacco Use  . Smoking status: Former Smoker    Packs/day: 5.00    Years: 3.00    Pack years: 15.00    Types: Cigarettes    Start date: 12/23/2015    Quit date: 08/17/2019    Years since quitting: 0.7  . Smokeless tobacco: Never Used  . Tobacco comment: currently vaping  Substance Use Topics  . Alcohol use: Yes    Alcohol/week: 2.0 standard drinks    Types: 2 Glasses of wine per week   family history includes Colon cancer in her father; Hypertension in her mother.  Medications: Current Outpatient  Medications  Medication Sig Dispense Refill  . buPROPion (WELLBUTRIN XL) 150 MG 24 hr tablet Take 300 mg by mouth daily.    Marland Kitchen etonogestrel-ethinyl estradiol (NUVARING) 0.12-0.015 MG/24HR vaginal ring Insert vaginally and leave in place for 3 consecutive weeks, then remove for 1 week. 3 each 4  . Minoxidil (ROGAINE WOMENS) 5 % FOAM Apply 1 application topically daily. After shampooing while wet. (Patient not taking: Reported on 05/16/2020) 60 g 5   No current facility-administered medications for this visit.   Allergies  Allergen Reactions   . Latex Hives, Swelling, Rash and Other (See Comments)    Chest tightness

## 2020-05-22 ENCOUNTER — Other Ambulatory Visit: Payer: Self-pay

## 2020-05-22 ENCOUNTER — Ambulatory Visit (INDEPENDENT_AMBULATORY_CARE_PROVIDER_SITE_OTHER): Payer: BC Managed Care – PPO | Admitting: Osteopathic Medicine

## 2020-05-22 ENCOUNTER — Encounter: Payer: Self-pay | Admitting: Osteopathic Medicine

## 2020-05-22 VITALS — BP 128/60 | HR 72 | Temp 98.0°F | Wt 198.0 lb

## 2020-05-22 DIAGNOSIS — Z30430 Encounter for insertion of intrauterine contraceptive device: Secondary | ICD-10-CM | POA: Diagnosis not present

## 2020-05-22 DIAGNOSIS — Z3009 Encounter for other general counseling and advice on contraception: Secondary | ICD-10-CM

## 2020-05-22 DIAGNOSIS — N898 Other specified noninflammatory disorders of vagina: Secondary | ICD-10-CM

## 2020-05-22 LAB — WET PREP FOR TRICH, YEAST, CLUE
MICRO NUMBER:: 10538222
Specimen Quality: ADEQUATE

## 2020-05-22 LAB — POCT URINE PREGNANCY: Preg Test, Ur: NEGATIVE

## 2020-05-22 MED ORDER — METRONIDAZOLE 500 MG PO TABS: 500.0000 mg | ORAL_TABLET | Freq: Two times a day (BID) | ORAL | 0 refills | Status: AC

## 2020-05-22 NOTE — Progress Notes (Signed)
IUD PROCEDURE NOTE  PERTINENT RESULTS REVIEWED: PREGNANCY TEST PRIOR TO PROCEDURE: Negative GONORRHEA/CHLAMYDIA SCREEN: Not Available but patietn reports negative testing earlier this year and no new exposures   PRIOR TO PROCEDURE: INFORMED CONSENT OBTAINED: yes SEE SCANNED DOCUMENTS ANY PRETREATMENT: ibuprofen 800 mg   PHYSICAL EXAM: GYN: No lesions/ulcers to external genitalia, normal urethra, normal vaginal mucosa, physiologic discharge, cervix normal without lesions, uterus not enlarged or tender, adnexa no masses and nontender  DESCRIPTION OF PROCEDURE: Vaginal speculum placed. Cervix and proximal vagina cleaned with Betadine.Tenaculum applied at 12:00 cervical position and gentle traction applied. Uterus sounded to 7 cm (greater than 6cm). IUD placed without difficulty. IUD threads cut to 2-3cm from cervical os. Tenaculum and speculum removed. Patient tolerated procedure well. Sterile technique maintained.   IUD INFORMATION: BRAND: ParaGard  CARD GIVEN TO PATIENT: yes   Treated also for BV

## 2020-05-22 NOTE — Patient Instructions (Signed)
IUD AFTER-CARE INSTRUCTIONS: READ THOROUGHLY  Your ParaGard IUD is currently approved to remain in place for 10 years. At that time, if you wish to receive a new IUD, this can be placed when your current one is removed. If you wish to remove your IUD at any time, for any reason, this can be done by your doctor.   You should feel for the strings to your IUD routinely. If you cannot locate the strings, it is recommended you alert your doctor of this.   Be aware that in the first few weeks, your new IUD may cause some discomfort/cramping as it settles into place in your uterus. To ease discomfort, you may apply heating pad to abdomen and take Ibuprofen 800mg  by mouth every 6 hours as needed, but avoid using this dose continuously for more than 5 days. Walking helps as well. You can also expect some irregular bleeding but it should not be heavy for more than a few days.   If pain is severe or if severe bleeding occurs, or if foul-smelling discharge or fever develops, or if you have any other concerns - contact your doctor right away or go to the Emergency Room.  IUD's are a very reliable method of birth control, but no method is 100% effective. If you think you may be pregnant, see your doctor right away.   An IUD will not protect you from sexually transmitted infections such as HIV, gonorrhea, chlamydia, HPV and others.   It is recommended that you see your doctor as directed for routine well-woman care, which includes Pap testing and may include screening for infections.

## 2020-05-29 ENCOUNTER — Encounter: Payer: Self-pay | Admitting: Osteopathic Medicine

## 2020-05-30 ENCOUNTER — Encounter: Payer: Self-pay | Admitting: Osteopathic Medicine

## 2020-05-30 ENCOUNTER — Ambulatory Visit (INDEPENDENT_AMBULATORY_CARE_PROVIDER_SITE_OTHER): Payer: BC Managed Care – PPO

## 2020-05-30 ENCOUNTER — Other Ambulatory Visit: Payer: Self-pay

## 2020-05-30 ENCOUNTER — Ambulatory Visit (INDEPENDENT_AMBULATORY_CARE_PROVIDER_SITE_OTHER): Payer: BC Managed Care – PPO | Admitting: Osteopathic Medicine

## 2020-05-30 VITALS — BP 141/73 | HR 97 | Temp 97.0°F | Wt 198.0 lb

## 2020-05-30 DIAGNOSIS — Z30431 Encounter for routine checking of intrauterine contraceptive device: Secondary | ICD-10-CM | POA: Diagnosis not present

## 2020-05-30 NOTE — Progress Notes (Signed)
Allison Kerr is a 24 y.o. female who presents to  [facility]  today, 05/30/20, seeking care for the following: . IUD check - feeling poking sensation in deep pelvis on the left side, NOT in the vagina. She can feel strings, does not feel the device itself poking out from cervix.     ASSESSMENT & PLAN with other pertinent history/findings:  The encounter diagnosis was IUD check up.  Normal pelvic exam, no adnexal tenderness on L or R, uterus feels retroverted. IUD strings visible, I do not see the device In the cervical os.   Addendum 05/30/20 4:50 PM Reviewed results w/ patient - IUD in place - will monitor, RTC prn    Patient Instructions  Ultrasound today at 4:00!   If IUD is in place normally, it may be causing some discomfort as it "settles" into place. We can continue to monitor and use ibuprofen 800 mg 3-4 times per day as needed for pain while it hopefully gets better with time.   If it's into the uterine muscle or otherwise not where it should be, we can take it out!      Orders Placed This Encounter  Procedures  . US PELVIS TRANSVAGINAL NON-OB (TV ONLY)   Images personally reviewed - await over-read   US PELVIS TRANSVAGINAL NON-OB (TV ONLY)  Result Date: 05/30/2020 CLINICAL DATA:  Concern for intrauterine device placement EXAM: ULTRASOUND PELVIS TRANSVAGINAL TECHNIQUE: Transvaginal ultrasound examination of the pelvis was performed including evaluation of the uterus, ovaries, adnexal regions, and pelvic cul-de-sac. COMPARISON:  None. FINDINGS: Uterus Measurements: 7.7 x 2.7 x 5.0 cm = volume: 54 mL. No fibroids or other mass visualized. Endometrium Thickness: 7 mm. No focal abnormality visualized. Intrauterine device positioned within the endometrium. Right ovary Measurements: 2.5 x 1.8 x 2.0 cm = volume: 5.0 mL. Normal appearance/no adnexal mass. Left ovary Measurements: 2.3 x 1.8 x 2.2 cm = volume: 5.0 mL. Normal  appearance/no adnexal mass. 1.7 x 1.7 cm physiologic follicle left ovary. Other findings:  No abnormal free fluid IMPRESSION: Intrauterine device positioned within the endometrium. Study otherwise unremarkable. Electronically Signed   By: Bretta Bang III M.D.   On: 05/30/2020 16:27        Follow-up instructions: Return for RECHECK PENDING RESULTS.                                         BP (!) 141/73 (BP Location: Left Arm, Patient Position: Sitting)   Pulse 97   Temp (!) 97 F (36.1 C)   Wt 198 lb (89.8 kg)   LMP 05/10/2020   BMI 30.11 kg/m   Current Meds  Medication Sig  . buPROPion (WELLBUTRIN XL) 150 MG 24 hr tablet Take 300 mg by mouth daily.    No results found for this or any previous visit (from the past 72 hour(s)).  US PELVIS TRANSVAGINAL NON-OB (TV ONLY)  Result Date: 05/30/2020 CLINICAL DATA:  Concern for intrauterine device placement EXAM: ULTRASOUND PELVIS TRANSVAGINAL TECHNIQUE: Transvaginal ultrasound examination of the pelvis was performed including evaluation of the uterus, ovaries, adnexal regions, and pelvic cul-de-sac. COMPARISON:  None. FINDINGS: Uterus Measurements: 7.7 x 2.7 x 5.0 cm = volume: 54 mL. No fibroids or other mass visualized. Endometrium Thickness: 7 mm. No focal abnormality visualized. Intrauterine device positioned within the endometrium. Right ovary Measurements: 2.5 x 1.8 x  2.0 cm = volume: 5.0 mL. Normal appearance/no adnexal mass. Left ovary Measurements: 2.3 x 1.8 x 2.2 cm = volume: 5.0 mL. Normal appearance/no adnexal mass. 1.7 x 1.7 cm physiologic follicle left ovary. Other findings:  No abnormal free fluid IMPRESSION: Intrauterine device positioned within the endometrium. Study otherwise unremarkable. Electronically Signed   By: Lowella Grip III M.D.   On: 05/30/2020 16:27      All questions at time of visit were answered - patient instructed to contact office with any additional concerns or  updates.  ER/RTC precautions were reviewed with the patient.  Please note: voice recognition software was used to produce this document, and typos may escape review. Please contact Dr. Sheppard Coil for any needed clarifications.

## 2020-05-30 NOTE — Patient Instructions (Signed)
Ultrasound today at 4:00!   If IUD is in place normally, it may be causing some discomfort as it "settles" into place. We can continue to monitor and use ibuprofen 800 mg 3-4 times per day as needed for pain while it hopefully gets better with time.   If it's into the uterine muscle or otherwise not where it should be, we can take it out!

## 2020-06-19 ENCOUNTER — Encounter: Payer: Self-pay | Admitting: Osteopathic Medicine

## 2020-06-19 ENCOUNTER — Other Ambulatory Visit: Payer: Self-pay

## 2020-06-19 ENCOUNTER — Ambulatory Visit (INDEPENDENT_AMBULATORY_CARE_PROVIDER_SITE_OTHER): Payer: BC Managed Care – PPO | Admitting: Osteopathic Medicine

## 2020-06-19 VITALS — BP 106/79 | HR 79 | Wt 203.0 lb

## 2020-06-19 DIAGNOSIS — Z975 Presence of (intrauterine) contraceptive device: Secondary | ICD-10-CM

## 2020-06-19 NOTE — Progress Notes (Signed)
CC: IUD string checkup S: Paragard IUD inserted 05/30/2020, since then no problems O: GYN: No lesions/ulcers to external genitalia, normal urethra, normal vaginal mucosa, physiologic discharge, cervix normal without lesions, IUD strings visible and expected length A/P: IUD monitoring. IUD strings left alone, were not trimmed

## 2020-07-10 ENCOUNTER — Ambulatory Visit (INDEPENDENT_AMBULATORY_CARE_PROVIDER_SITE_OTHER): Payer: BC Managed Care – PPO | Admitting: Family Medicine

## 2020-07-10 ENCOUNTER — Encounter: Payer: Self-pay | Admitting: Family Medicine

## 2020-07-10 VITALS — BP 110/60 | HR 74 | Ht 68.0 in | Wt 206.0 lb

## 2020-07-10 DIAGNOSIS — R140 Abdominal distension (gaseous): Secondary | ICD-10-CM

## 2020-07-10 DIAGNOSIS — R635 Abnormal weight gain: Secondary | ICD-10-CM

## 2020-07-10 DIAGNOSIS — L659 Nonscarring hair loss, unspecified: Secondary | ICD-10-CM

## 2020-07-10 DIAGNOSIS — R634 Abnormal weight loss: Secondary | ICD-10-CM | POA: Diagnosis not present

## 2020-07-10 NOTE — Progress Notes (Signed)
Established Patient Office Visit  Subjective:  Patient ID: Allison Kerr, female    DOB: 10-15-1996  Age: 24 y.o. MRN: [id number]  CC:  Chief Complaint  Patient presents with  . Weight Gain    HPI Allison Kerr presents for abnormal weight gain.  She says really starting in about May she has been putting on weight.  She says previous to that she was actually in school and says she really was not eating that healthy she was not working out consistently and had actually lost weight to about 190 pounds and since then she has gained about 15 to 16 pounds over the last 3 months.  She has noticed an increase in her hair loss which is been an ongoing issue and she has felt more bloated.  She does battle with constipation some but says she regularly takes fiber supplements and it normally regulates things.  She is now been exercising consistently 3 days a week she has been really watching her diet and eating a low-carb diet she is been doing cardio and weights and just cannot seem to get the weight back off.  Her periods have been regular she did have a ParaGard IUD put in about 2 months ago.  She also complains of low energy but says this is not new.  No other unusual fevers chills rashes etc.  She does take a multivitamin and a B6.  Past Medical History:  Diagnosis Date  . Depressed     Past Surgical History:  Procedure Laterality Date  . TONSILLECTOMY      Family History  Problem Relation Age of Onset  . Hypertension Mother   . Colon cancer Father     Social History   Socioeconomic History  . Marital status: Single    Spouse name: Not on file  . Number of children: 0  . Years of education: Not on file  . Highest education level: Not on file  Occupational History  . Occupation: Archivist.   Tobacco Use  . Smoking status: Former Smoker    Packs/day: 5.00    Years: 3.00    Pack years: 15.00    Types: Cigarettes    Start date: 12/23/2015    Quit date: 08/17/2019    Years  since quitting: 0.8  . Smokeless tobacco: Never Used  . Tobacco comment: currently vaping  Substance and Sexual Activity  . Alcohol use: Yes    Alcohol/week: 2.0 standard drinks    Types: 2 Glasses of wine per week  . Drug use: Never  . Sexual activity: Not Currently    Partners: Male    Birth control/protection: Abstinence, Inserts    Comment: ring  Other Topics Concern  . Not on file  Social History Narrative   Step mom is Andra Heslin.    Social Determinants of Health   Financial Resource Strain:   . Difficulty of Paying Living Expenses:   Food Insecurity:   . Worried About Programme researcher, broadcasting/film/video in the Last Year:   . Barista in the Last Year:   Transportation Needs:   . Freight forwarder (Medical):   Marland Kitchen Lack of Transportation (Non-Medical):   Physical Activity:   . Days of Exercise per Week:   . Minutes of Exercise per Session:   Stress:   . Feeling of Stress :   Social Connections:   . Frequency of Communication with Friends and Family:   . Frequency of Social Gatherings with Friends and  Family:   . Attends Religious Services:   . Active Member of Clubs or Organizations:   . Attends Banker Meetings:   Marland Kitchen Marital Status:   Intimate Partner Violence:   . Fear of Current or Ex-Partner:   . Emotionally Abused:   Marland Kitchen Physically Abused:   . Sexually Abused:     Outpatient Medications Prior to Visit  Medication Sig Dispense Refill  . buPROPion (WELLBUTRIN XL) 150 MG 24 hr tablet Take 300 mg by mouth daily.    . betamethasone dipropionate 0.05 % lotion APPLY TO THE SCALP DAILY. TAPER USE AS ABLE    . Minoxidil (ROGAINE WOMENS) 5 % FOAM Apply 1 application topically daily. After shampooing while wet. (Patient not taking: Reported on 05/16/2020) 60 g 5   No facility-administered medications prior to visit.    Allergies  Allergen Reactions  . Latex Hives, Swelling, Rash and Other (See Comments)    Chest tightness    ROS Review of Systems     Objective:    Physical Exam Constitutional:      Appearance: She is well-developed.  HENT:     Head: Normocephalic and atraumatic.     Right Ear: External ear normal.     Left Ear: External ear normal.     Nose: Nose normal.     Mouth/Throat:     Mouth: Mucous membranes are moist.     Pharynx: Oropharynx is clear.  Eyes:     Conjunctiva/sclera: Conjunctivae normal.     Pupils: Pupils are equal, round, and reactive to light.  Neck:     Thyroid: No thyromegaly.  Cardiovascular:     Rate and Rhythm: Normal rate and regular rhythm.     Heart sounds: Normal heart sounds.  Pulmonary:     Effort: Pulmonary effort is normal.     Breath sounds: Normal breath sounds. No wheezing.  Abdominal:     General: Abdomen is flat. Bowel sounds are normal.     Palpations: Abdomen is soft.  Musculoskeletal:     Cervical back: Neck supple.  Lymphadenopathy:     Cervical: No cervical adenopathy.  Skin:    General: Skin is warm and dry.  Neurological:     General: No focal deficit present.     Mental Status: She is alert and oriented to person, place, and time.     Sensory: Sensory deficit:    Psychiatric:        Mood and Affect: Mood normal.        Behavior: Behavior normal.     BP 110/60   Pulse 74   Ht 5\' 8"  (1.727 m)   Wt 206 lb (93.4 kg)   LMP 07/09/2020 (Exact Date)   SpO2 100%   BMI 31.32 kg/m  Wt Readings from Last 3 Encounters:  07/10/20 206 lb (93.4 kg)  06/19/20 203 lb (92.1 kg)  05/30/20 198 lb (89.8 kg)     Health Maintenance Due  Topic Date Due  . COVID-19 Vaccine (1) Never done    There are no preventive care reminders to display for this patient.  Lab Results  Component Value Date   TSH 2.84 08/17/2019   Lab Results  Component Value Date   WBC 8.2 08/17/2019   HGB 13.9 08/17/2019   HCT 43.2 08/17/2019   MCV 86.7 08/17/2019   PLT 250 08/17/2019   Lab Results  Component Value Date   NA 137 08/17/2019   K 4.6 08/17/2019   CO2 26 08/17/2019  GLUCOSE 88 08/17/2019   BUN 19 08/17/2019   CREATININE 1.02 08/17/2019   BILITOT 0.3 08/17/2019   AST 22 08/17/2019   ALT 15 08/17/2019   PROT 7.2 08/17/2019   CALCIUM 10.0 08/17/2019   Lab Results  Component Value Date   CHOL 201 (H) 08/17/2019   Lab Results  Component Value Date   HDL 46 (L) 08/17/2019   Lab Results  Component Value Date   LDLCALC 136 (H) 08/17/2019   Lab Results  Component Value Date   TRIG 88 08/17/2019   Lab Results  Component Value Date   CHOLHDL 4.4 08/17/2019   No results found for: HGBA1C    Assessment & Plan:   Problem List Items Addressed This Visit      Other   Hair loss - Primary   Relevant Orders   TSH   COMPLETE METABOLIC PANEL WITH GFR   Lipid panel   TSH + free T4   CBC   Estradiol   Progesterone   Follicle stimulating hormone   Luteinizing hormone   Cortisol    Other Visit Diagnoses    Bloating       Relevant Orders   TSH   COMPLETE METABOLIC PANEL WITH GFR   Lipid panel   TSH + free T4   CBC   Estradiol   Progesterone   Follicle stimulating hormone   Luteinizing hormone   Cortisol   Abnormal weight gain       Relevant Orders   TSH   COMPLETE METABOLIC PANEL WITH GFR   Lipid panel   TSH + free T4   CBC   Estradiol   Progesterone   Follicle stimulating hormone   Luteinizing hormone   Cortisol      Abnormal weight loss with concomitant hair loss and bloating-unclear etiology will check thyroid.  Also check a.m. cortisol level.  It has been a year since she is had lab work so we will check electrolytes as well as renal and liver function.  We will check hormone levels as well the periods again have been regular recently.  No orders of the defined types were placed in this encounter.   Follow-up: Return if symptoms worsen or fail to improve.    Nani Gasser, MD

## 2020-07-10 NOTE — Progress Notes (Signed)
Pt reports that prior to May she was weighing around 190lbs. She also stated that before getting the Paragard IUD she had the Mirena.   She has been exercising and eating a healthy diet. She does an electrolyte drinks.

## 2020-07-11 LAB — COMPLETE METABOLIC PANEL WITH GFR
AG Ratio: 1.8 (calc) (ref 1.0–2.5)
ALT: 29 U/L (ref 6–29)
AST: 26 U/L (ref 10–30)
Albumin: 4.6 g/dL (ref 3.6–5.1)
Alkaline phosphatase (APISO): 74 U/L (ref 31–125)
BUN: 13 mg/dL (ref 7–25)
CO2: 26 mmol/L (ref 20–32)
Calcium: 9.8 mg/dL (ref 8.6–10.2)
Chloride: 105 mmol/L (ref 98–110)
Creat: 0.96 mg/dL (ref 0.50–1.10)
GFR, Est African American: 95 mL/min/{1.73_m2} (ref >=60)
GFR, Est Non African American: 82 mL/min/{1.73_m2} (ref >=60)
Globulin: 2.6 g/dL (calc) (ref 1.9–3.7)
Glucose, Bld: 95 mg/dL (ref 65–99)
Potassium: 5.1 mmol/L (ref 3.5–5.3)
Sodium: 139 mmol/L (ref 135–146)
Total Bilirubin: 0.3 mg/dL (ref 0.2–1.2)
Total Protein: 7.2 g/dL (ref 6.1–8.1)

## 2020-07-11 LAB — TSH+FREE T4: TSH W/REFLEX TO FT4: 3.94 mIU/L

## 2020-07-11 LAB — FOLLICLE STIMULATING HORMONE: FSH: 2.9 m[IU]/mL

## 2020-07-11 LAB — LIPID PANEL
Cholesterol: 216 mg/dL — ABNORMAL HIGH (ref <200)
HDL: 44 mg/dL — ABNORMAL LOW (ref >=50)
LDL Cholesterol (Calc): 157 mg/dL (calc) — ABNORMAL HIGH
Non-HDL Cholesterol (Calc): 172 mg/dL (calc) — ABNORMAL HIGH (ref <130)
Total CHOL/HDL Ratio: 4.9 (calc) (ref <5.0)
Triglycerides: 57 mg/dL (ref <150)

## 2020-07-11 LAB — LUTEINIZING HORMONE: LH: 5.3 m[IU]/mL

## 2020-07-11 LAB — CBC
HCT: 42.1 % (ref 35.0–45.0)
Hemoglobin: 13.9 g/dL (ref 11.7–15.5)
MCH: 28.8 pg (ref 27.0–33.0)
MCHC: 33 g/dL (ref 32.0–36.0)
MCV: 87.2 fL (ref 80.0–100.0)
MPV: 11.3 fL (ref 7.5–12.5)
Platelets: 282 10*3/uL (ref 140–400)
RBC: 4.83 10*6/uL (ref 3.80–5.10)
RDW: 11.8 % (ref 11.0–15.0)
WBC: 7.2 10*3/uL (ref 3.8–10.8)

## 2020-07-11 LAB — PROGESTERONE: Progesterone: 4.6 ng/mL

## 2020-07-11 LAB — ESTRADIOL: Estradiol: 117 pg/mL

## 2020-07-11 LAB — CORTISOL: Cortisol, Plasma: 18.3 ug/dL

## 2020-12-10 DIAGNOSIS — N3001 Acute cystitis with hematuria: Secondary | ICD-10-CM | POA: Diagnosis not present

## 2020-12-31 DIAGNOSIS — U071 COVID-19: Secondary | ICD-10-CM | POA: Diagnosis not present

## 2021-05-25 IMAGING — US US TRANSVAGINAL NON-OB
1 series · 14 of 25 positions shown · non-contrast
Comparison: None.

CLINICAL DATA: Concern for intrauterine device placement

EXAM:
ULTRASOUND PELVIS TRANSVAGINAL
TECHNIQUE: Transvaginal ultrasound examination of the pelvis was performed
including evaluation of the uterus, ovaries, adnexal regions, and
pelvic cul-de-sac.

[Series 1: us transvaginal non-ob · 0.09mm/px · 57 acquisitions, 14 frames shown]
[im 1/57]
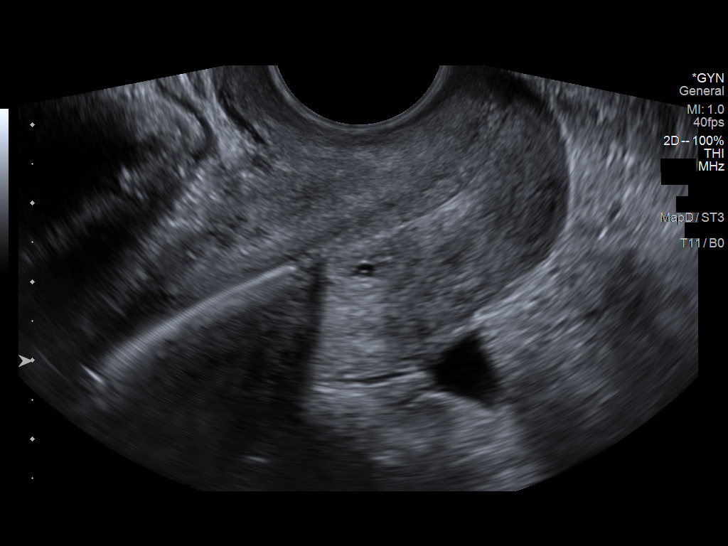
[im 5/57]
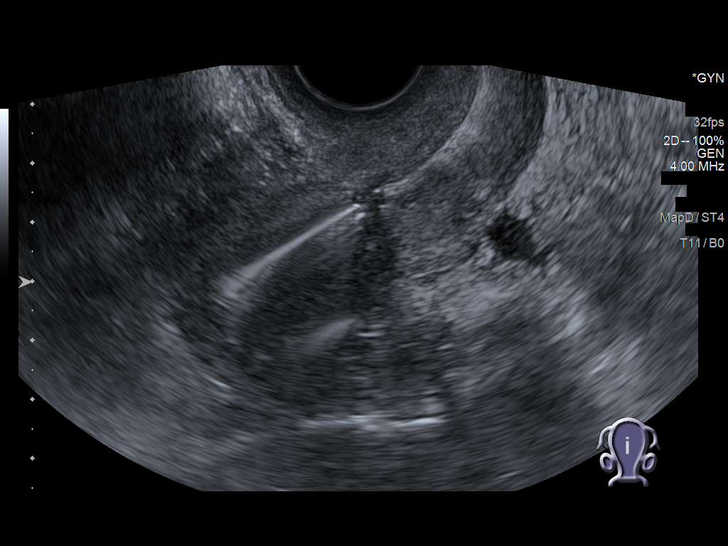
[im 10/57]
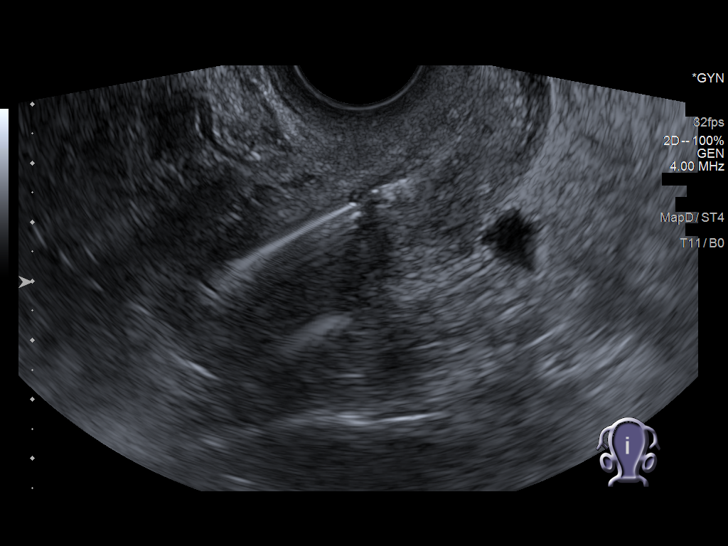
[im 15/57]
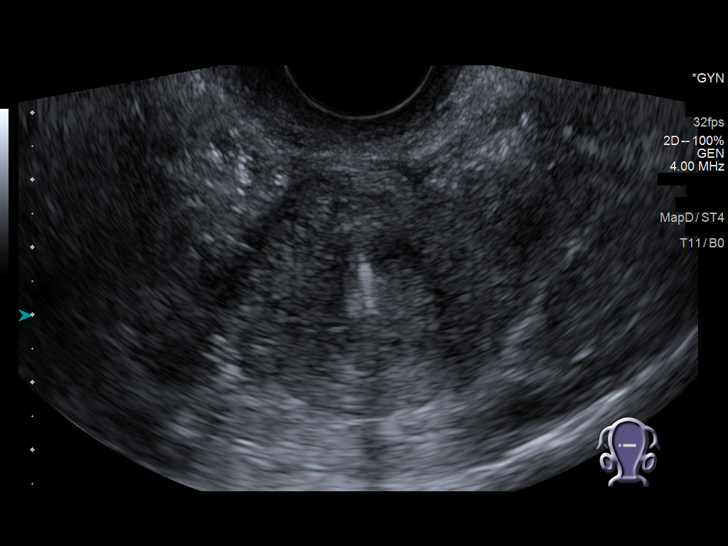
[im 19/57]
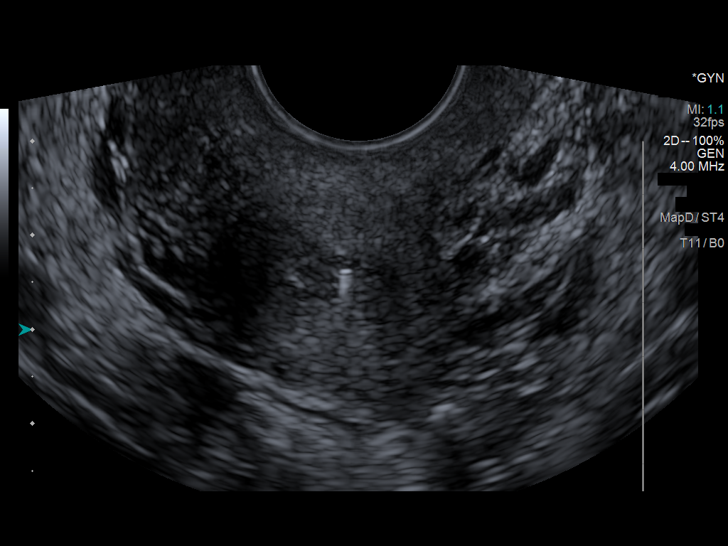
[im 22/57]
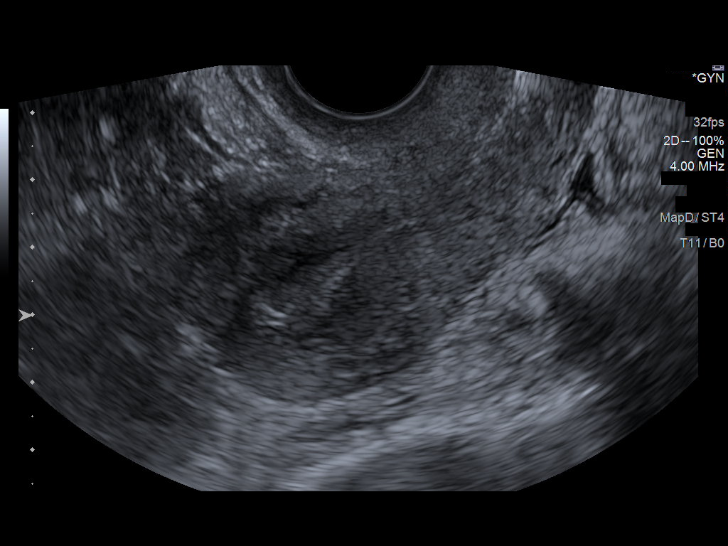
[im 26/57]
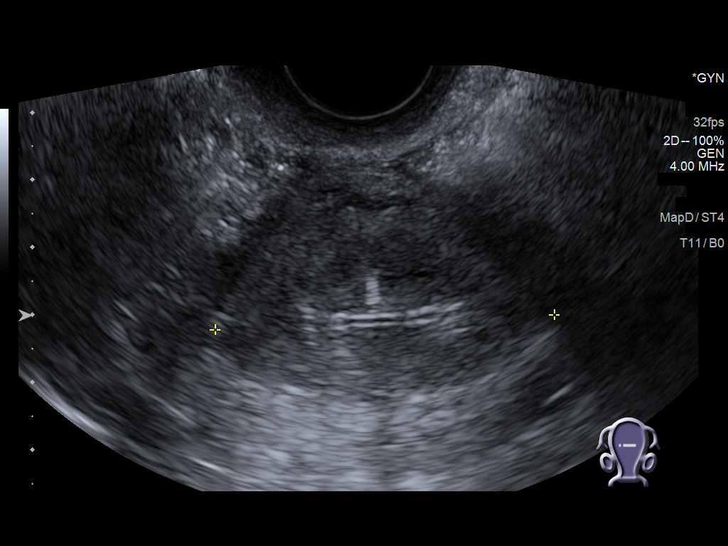
[im 31/57]
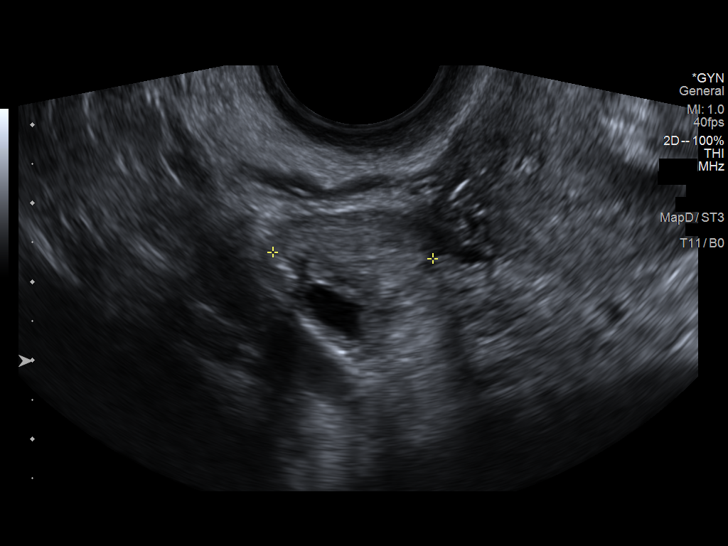
[im 36/57]
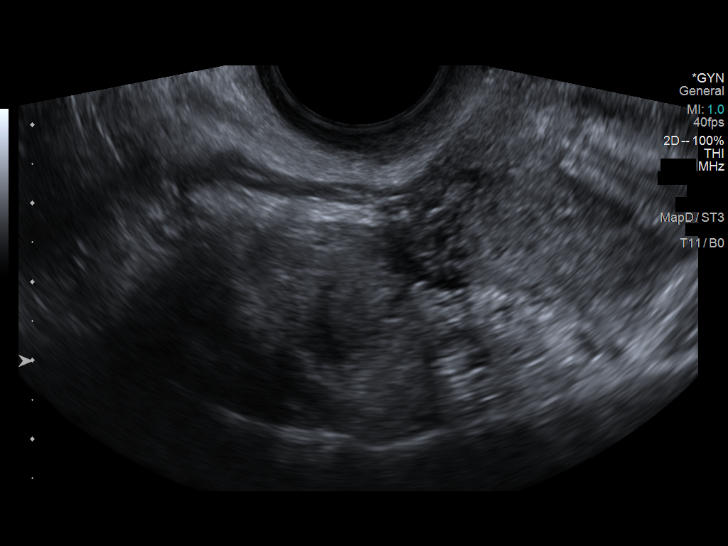
[im 38/57]
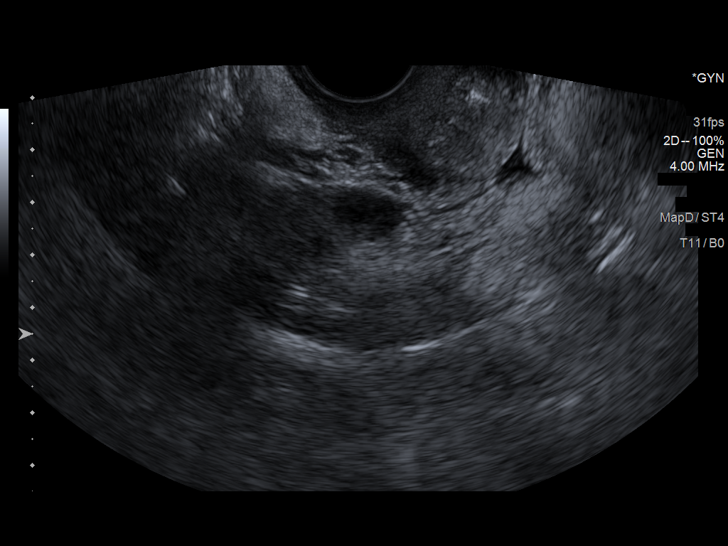
[im 43/57]
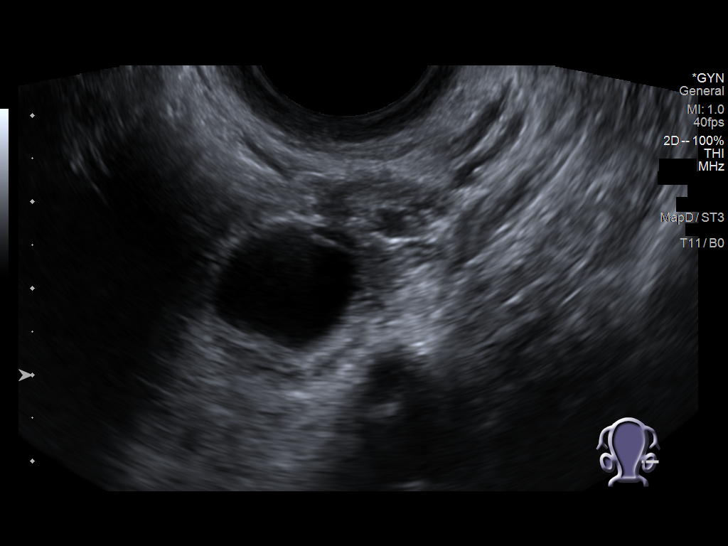
[im 47/57]
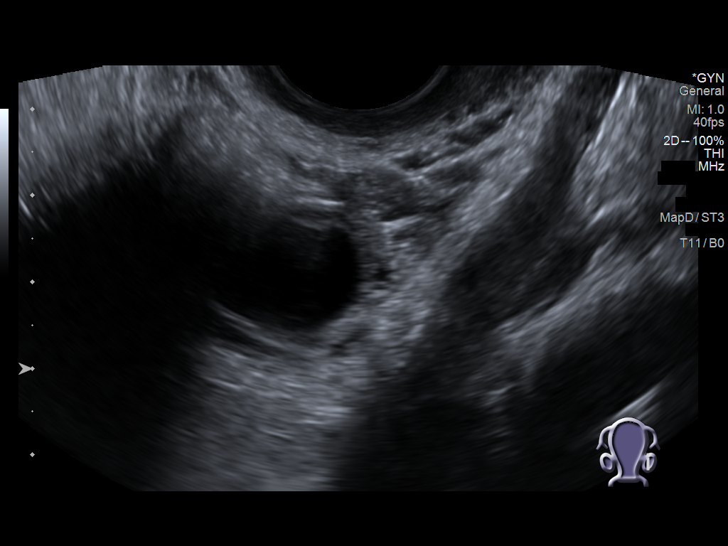
[im 52/57]
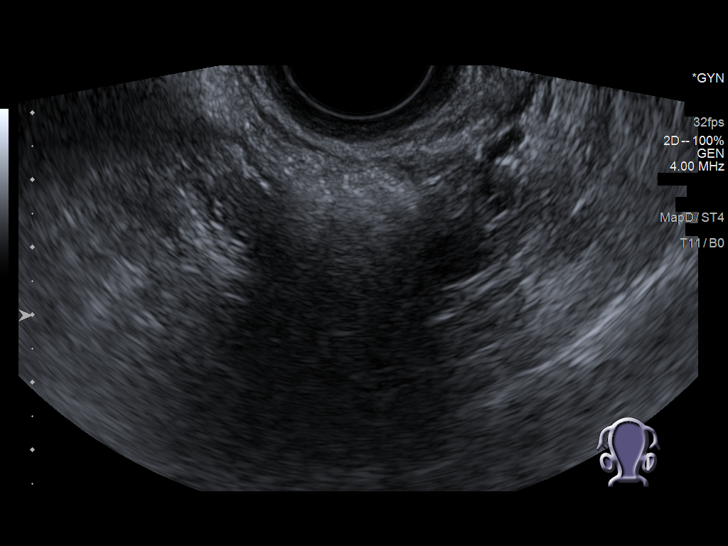
[im 57/57]
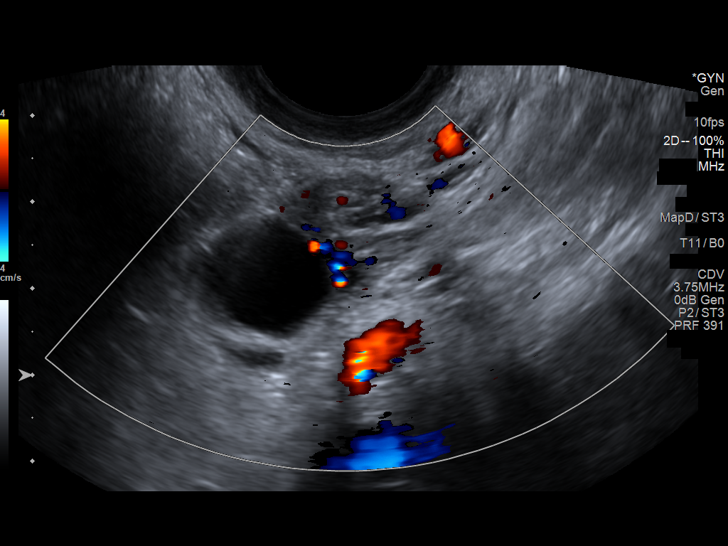

[14 of 25 positions shown; findings below may reference images not displayed]

FINDINGS: Uterus

Measurements: 7.7 x 2.7 x 5.0 cm = volume: 54 mL. No fibroids or
other mass visualized.

Endometrium

Thickness: 7 mm. No focal abnormality visualized. Intrauterine
device positioned within the endometrium.

Right ovary

Measurements: 2.5 x 1.8 x 2.0 cm = volume: 5.0 mL. Normal
appearance/no adnexal mass.

Left ovary

Measurements: 2.3 x 1.8 x 2.2 cm = volume: 5.0 mL. Normal
appearance/no adnexal mass. 1.7 x 1.7 cm physiologic follicle left
ovary.

Other findings:  No abnormal free fluid
IMPRESSION: Intrauterine device positioned within the endometrium. Study
otherwise unremarkable.
